# Patient Record
Sex: Male | Born: 1965 | Hispanic: Yes | Marital: Married | Smoking: Never smoker
Health system: Southern US, Community
[De-identification: ages and names within clinical notes are randomized; demographics above are authoritative.]

---

## 2015-06-28 ENCOUNTER — Emergency Department (HOSPITAL_COMMUNITY)
Admission: EM | Admit: 2015-06-28 | Discharge: 2015-06-28 | Disposition: A | Discharge status: Home / Self Care | Payer: Worker's Compensation | Attending: Emergency Medicine | Admitting: Emergency Medicine

## 2015-06-28 ENCOUNTER — Emergency Department (HOSPITAL_COMMUNITY): Payer: Worker's Compensation

## 2015-06-28 ENCOUNTER — Encounter (HOSPITAL_COMMUNITY): Payer: Self-pay | Admitting: Emergency Medicine

## 2015-06-28 DIAGNOSIS — S59901A Unspecified injury of right elbow, initial encounter: Secondary | ICD-10-CM | POA: Insufficient documentation

## 2015-06-28 DIAGNOSIS — Y9289 Other specified places as the place of occurrence of the external cause: Secondary | ICD-10-CM | POA: Insufficient documentation

## 2015-06-28 DIAGNOSIS — Y9389 Activity, other specified: Secondary | ICD-10-CM | POA: Insufficient documentation

## 2015-06-28 DIAGNOSIS — S81812A Laceration without foreign body, left lower leg, initial encounter: Secondary | ICD-10-CM | POA: Diagnosis not present

## 2015-06-28 DIAGNOSIS — W11XXXA Fall on and from ladder, initial encounter: Secondary | ICD-10-CM | POA: Diagnosis not present

## 2015-06-28 DIAGNOSIS — IMO0002 Reserved for concepts with insufficient information to code with codable children: Secondary | ICD-10-CM

## 2015-06-28 DIAGNOSIS — Z23 Encounter for immunization: Secondary | ICD-10-CM | POA: Insufficient documentation

## 2015-06-28 DIAGNOSIS — S3992XA Unspecified injury of lower back, initial encounter: Secondary | ICD-10-CM | POA: Insufficient documentation

## 2015-06-28 DIAGNOSIS — S81811A Laceration without foreign body, right lower leg, initial encounter: Secondary | ICD-10-CM | POA: Insufficient documentation

## 2015-06-28 DIAGNOSIS — Y998 Other external cause status: Secondary | ICD-10-CM | POA: Insufficient documentation

## 2015-06-28 DIAGNOSIS — W19XXXA Unspecified fall, initial encounter: Secondary | ICD-10-CM

## 2015-06-28 MED ORDER — LIDOCAINE-EPINEPHRINE 1 %-1:100000 IJ SOLN
20.0000 mL | Freq: Once | INTRAMUSCULAR | Status: AC
  Administered 2015-06-28: 20 mL via INTRADERMAL
  Filled 2015-06-28: qty 1

## 2015-06-28 MED ORDER — OXYCODONE-ACETAMINOPHEN 5-325 MG PO TABS: 1.0000 | ORAL_TABLET | Freq: Four times a day (QID) | ORAL | Status: AC | PRN

## 2015-06-28 MED ORDER — OXYCODONE-ACETAMINOPHEN 5-325 MG PO TABS
1.0000 | ORAL_TABLET | Freq: Four times a day (QID) | ORAL | Status: DC | PRN
Start: 2015-06-28 — End: 2015-06-28
  Administered 2015-06-28: 1 via ORAL
  Filled 2015-06-28: qty 1

## 2015-06-28 MED ORDER — TETANUS-DIPHTH-ACELL PERTUSSIS 5-2.5-18.5 LF-MCG/0.5 IM SUSP
0.5000 mL | Freq: Once | INTRAMUSCULAR | Status: AC
Start: 2015-06-28 — End: 2015-06-28
  Administered 2015-06-28: 0.5 mL via INTRAMUSCULAR
  Filled 2015-06-28: qty 0.5

## 2015-06-28 NOTE — ED Notes (Signed)
Pt fell from ladder approximately 12 ft and landed on feet hitting left shin on fall. Pt denies hitting head or any LOC. Pt denies neck or back pain. Pt c/o of left shin pain with laceration and localized swelling. Bleeding is controlled. Pt also c/o of right elbow pain.

## 2015-06-28 NOTE — Discharge Instructions (Signed)
Have your staples removed in 10 days with your primary care doctor or an urgent care center.  Return to the emergency department if there is evidence of infection. See attached information for details regarding possible wound infection.

## 2015-06-28 NOTE — ED Notes (Signed)
Pt had of fent. Prior to arrival.

## 2015-06-28 NOTE — ED Provider Notes (Signed)
CSN: [id number]     Arrival date & time 06/28/15  1636 History   First MD Initiated Contact with Patient 06/28/15 1637     Chief Complaint  Patient presents with  . Fall     (Consider location/radiation/quality/duration/timing/severity/associated sxs/prior Treatment) Patient is a 50 y.o. male presenting with general illness. The history is provided by the patient. The history is limited by a language barrier. A language interpreter was used.  Illness Location:  Fall from ladder Severity:  Mild Onset quality:  Sudden Duration:  1 hour Timing:  Constant Progression:  Unchanged Chronicity:  New Context:  Feet were 12 feet off the ground. Was on a ladder. While falling scraped anterior aspects of bilateral legs. He landed on both feet. Did not fall down. Did not hit his head. Did not lose consciousness. Associated symptoms: no abdominal pain, no chest pain, no cough, no headaches, no nausea, no shortness of breath and no vomiting     History reviewed. No pertinent past medical history. History reviewed. No pertinent past surgical history. No family history on file. Social History  Substance Use Topics  . Smoking status: Never Smoker   . Smokeless tobacco: None  . Alcohol Use: No    Review of Systems  Respiratory: Negative for cough and shortness of breath.   Cardiovascular: Negative for chest pain.  Gastrointestinal: Negative for nausea, vomiting and abdominal pain.  Musculoskeletal: Positive for back pain. Negative for neck pain.  Skin: Positive for wound.  Neurological: Negative for syncope, light-headedness and headaches.  All other systems reviewed and are negative.     Allergies  Review of patient's allergies indicates no known allergies.  Home Medications   Prior to Admission medications   Medication Sig Start Date End Date Taking? Authorizing Provider  oxyCODONE-acetaminophen (PERCOCET/ROXICET) 5-325 MG tablet Take 1 tablet by mouth every 6 (six) hours as needed  for severe pain. 06/28/15   Lindalou Hose, MD   BP 128/77 mmHg  Pulse 69  Temp(Src) 98.3 F (36.8 C) (Oral)  Resp 15  Ht  (1.702 m)  Wt 86.183 kg  BMI 29.75 kg/m2  SpO2 100% Physical Exam  Constitutional: He is oriented to person, place, and time. He appears well-developed and well-nourished. No distress.  HENT:  Head: Normocephalic and atraumatic.  Eyes: EOM are normal. Pupils are equal, round, and reactive to light.  Neck: No spinous process tenderness present.  Cardiovascular: Normal rate, regular rhythm and intact distal pulses.   Pulmonary/Chest: Effort normal. No respiratory distress.  Abdominal: Soft. There is no tenderness.  Musculoskeletal: He exhibits no edema.       Right elbow: He exhibits normal range of motion, no deformity and no laceration. Tenderness found.       Right hip: Normal.       Left hip: Normal.       Cervical back: He exhibits no tenderness.       Thoracic back: He exhibits no tenderness.       Lumbar back: He exhibits bony tenderness.       Right upper leg: Normal.       Left upper leg: Normal.       Right lower leg: He exhibits laceration.       Left lower leg: He exhibits laceration.       Right foot: There is no tenderness.       Left foot: Normal. There is no tenderness.  Neurological: He is alert and oriented to person, place, and time. He  has normal strength. No sensory deficit. GCS eye subscore is 4. GCS verbal subscore is 5. GCS motor subscore is 6.  Skin: Skin is warm and dry.    ED Course  .Marland KitchenLaceration Repair Date/Time: 06/29/2015 3:07 PM Performed by: Lindalou Hose Authorized by: Lindalou Hose Consent: Verbal consent obtained. Consent given by: patient Patient understanding: patient states understanding of the procedure being performed Body area: lower extremity Location details: right lower leg Laceration length: 7 cm Foreign bodies: no foreign bodies Tendon involvement: none Nerve involvement: none Vascular damage:  no Anesthesia: local infiltration Local anesthetic: lidocaine 1% without epinephrine Anesthetic total: 5 ml Patient sedated: no Irrigation solution: saline Irrigation method: jet lavage Amount of cleaning: standard Debridement: none Degree of undermining: none Skin closure: staples Technique: simple Approximation: close Approximation difficulty: simple Patient tolerance: Patient tolerated the procedure well with no immediate complications  .Marland KitchenLaceration Repair Date/Time: 06/29/2015 3:07 PM Performed by: Lindalou Hose Authorized by: Lindalou Hose Consent: Verbal consent obtained. Consent given by: patient Patient understanding: patient states understanding of the procedure being performed Body area: lower extremity Location details: left lower leg Laceration length: 6 cm Foreign bodies: no foreign bodies Tendon involvement: none Nerve involvement: none Vascular damage: no Anesthesia: local infiltration Local anesthetic: lidocaine 1% without epinephrine Anesthetic total: 5 ml Patient sedated: no Irrigation solution: saline Irrigation method: jet lavage Amount of cleaning: standard Debridement: none Degree of undermining: none Skin closure: staples Technique: simple Approximation: close Approximation difficulty: simple Patient tolerance: Patient tolerated the procedure well with no immediate complications   (including critical care time) Labs Review Labs Reviewed - No data to display  Imaging Review Dg Lumbar Spine 2-3 Views  06/28/2015  CLINICAL DATA:  Slipped at work today and fell 12 feet off a ladder landing on feet, struck both shins on the ladder, no loss of consciousness, slight back tenderness, shin lacerations and pain, RIGHT posterior elbow pain that sharply shoots up into the shoulder when elbow is rotated, initial encounter EXAM: LUMBAR SPINE - 2-3 VIEW COMPARISON:  None FINDINGS: Five non-rib-bearing lumbar vertebra. Vertebral body and disc space heights  maintained. Small superior endplate spur at L4. No acute fracture, subluxation or bone destruction. SI joints symmetric. IMPRESSION: No acute osseous abnormalities. Electronically Signed   By: Ulyses Southward M.D.   On: 06/28/2015 18:02   Dg Pelvis 1-2 Views  06/28/2015  CLINICAL DATA:  Slipped at work today and fell 12 feet off a ladder landing on feet, struck both shins on the ladder, no loss of consciousness, slight back tenderness, shin lacerations and pain, RIGHT posterior elbow pain that sharply shoots up into the shoulder when elbow is rotated, initial encounter EXAM: PELVIS - 1-2 VIEW COMPARISON:  None FINDINGS: Hip and SI joints symmetric and preserved. Osseous mineralization normal. No acute fracture, dislocation, or bone destruction. IMPRESSION: No acute osseous abnormalities. Electronically Signed   By: Ulyses Southward M.D.   On: 06/28/2015 18:04   Dg Elbow 2 Views Right  06/28/2015  CLINICAL DATA:  Slipped at work today and fell 12 feet off a ladder landing on feet, struck both shins on the ladder, no loss of consciousness, slight back tenderness, shin lacerations and pain, RIGHT posterior elbow pain that sharply shoots up into the shoulder when elbow is rotated, initial encounter EXAM: RIGHT ELBOW - 2 VIEW COMPARISON:  None FINDINGS: Bone mineralization normal. Joint spaces preserved. No fracture, dislocation, or bone destruction. No elbow joint effusion. IMPRESSION: No acute osseous abnormalities. Electronically Signed   By: Loraine Leriche  Tyron RussellBoles M.D.   On: 06/28/2015 18:00   Dg Tibia/fibula Left  06/28/2015  ADDENDUM REPORT: 06/28/2015 18:05 ADDENDUM: Correction of typographical errors: CLINICAL DATA:  Slipped at work today and fell 12 feet off a ladder landing on feet, struck both shins on the ladder, no loss of consciousness, slight back tenderness, shin lacerations and pain, RIGHT posterior elbow pain that sharply shoots up into the shoulder when elbow is rotated, initial encounter Electronically Signed    By: Ulyses SouthwardMark  Boles M.D.   On: 06/28/2015 18:05  06/28/2015  CLINICAL DATA:  Slipped at work today and fell 12 feet off a ladder landing on feet, struck both shins on the ladder, no loss of consciousness, slight back tenderness, she lacerations and pain, RIGHT posterior elbow pain at sharply shoots up into the shoulder when elbow is rotated, initial encounter EXAM: LEFT TIBIA AND FIBULA - 2 VIEW COMPARISON:  None FINDINGS: Osseous mineralization normal. Knee and elbow joint alignments normal. No acute fracture, dislocation, or bone destruction. Bony excrescence identified at the posterior margin of the proximal fibula, appears non articular, question osteochondroma, unlikely a spur related to the proximal tibial fibular joint. IMPRESSION: No acute osseous abnormalities. Question osteochondroma arising from the proximal LEFT fibula. Electronically Signed: By: Ulyses SouthwardMark  Boles M.D. On: 06/28/2015 18:00   Dg Tibia/fibula Right  06/28/2015  CLINICAL DATA:  Slipped at work today and fell 12 feet off a ladder landing on feet, struck both shins on the ladder, no loss of consciousness, slight back tenderness, shin lacerations and pain, RIGHT posterior elbow pain that sharply shoots up into the shoulder when elbow is rotated, initial encounter EXAM: RIGHT TIBIA AND FIBULA - 2 VIEW COMPARISON:  None FINDINGS: Osseous mineralization normal. Joint spaces preserved. No fracture, dislocation, or bone destruction. Small foci of soft tissue gas laterally at the mid RIGHT lower leg consistent with history of laceration. IMPRESSION: No acute osseous abnormalities. Electronically Signed   By: Ulyses SouthwardMark  Boles M.D.   On: 06/28/2015 18:04   I have personally reviewed and evaluated these images and lab results as part of my medical decision-making.   EKG Interpretation None      MDM   Final diagnoses:  Fall  Laceration    50 year old male presents after falling from a ladder. No loss conscious. Denies headache or vision changes.  Doesn't have any focal neurological deficits. Doubt intracranial injury. Did have some tenderness palpation of his lower back. No evidence of fracture on the films. Also had some tenderness in his right elbow and bilateral lower legs. No evidence of fractures on xrays for those either. No tenderness palpation of his feet. Doubt calcaneal injury. Lacerations to anterior aspect of bilateral lower legs. See above for repair information. Tetanus given.  Return precautions provided. Encouraged him to follow up with his primary care doctor or an urgent care center in 10-14 days for staple remover. Warned him about the possibility of occult fracture. Patient discharged in stable condition.  Lindalou HoseSean O'Rourke, MD 06/29/15 [id number]  Zadie Rhineonald Wickline, MD 06/30/15 [phone]

## 2017-07-31 IMAGING — CR DG TIBIA/FIBULA 2V*R*
3 series · 3 of 3 positions shown · non-contrast
Comparison: None

CLINICAL DATA: Slipped at work today and fell 12 feet off a ladder
landing on feet, struck both shins on the ladder, no loss of
consciousness, slight back tenderness, shin lacerations and pain,
RIGHT posterior elbow pain that sharply shoots up into the shoulder
when elbow is rotated, initial encounter

EXAM:
RIGHT TIBIA AND FIBULA - 2 VIEW

[tibia ap (1 of 2)]
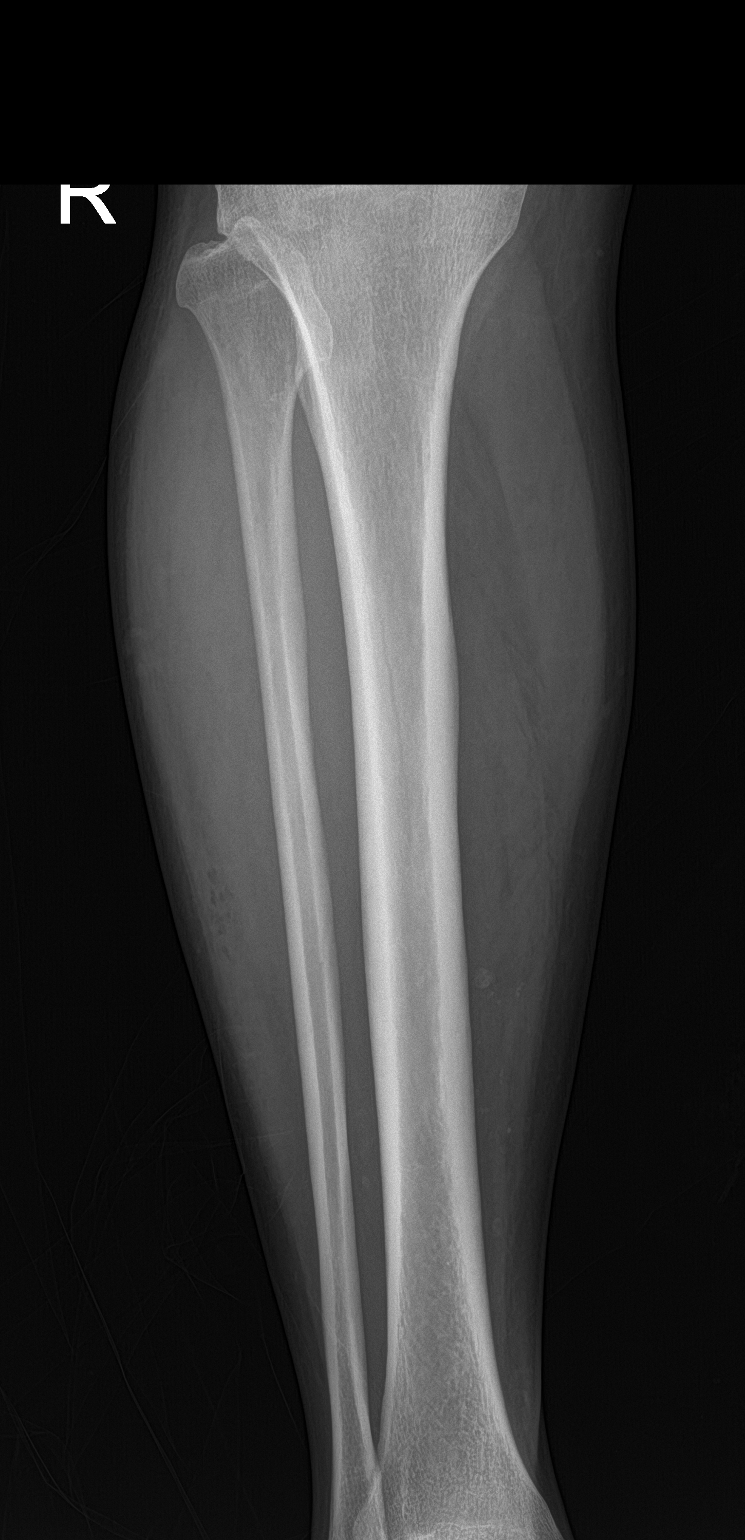

[tibia ap (2 of 2)]
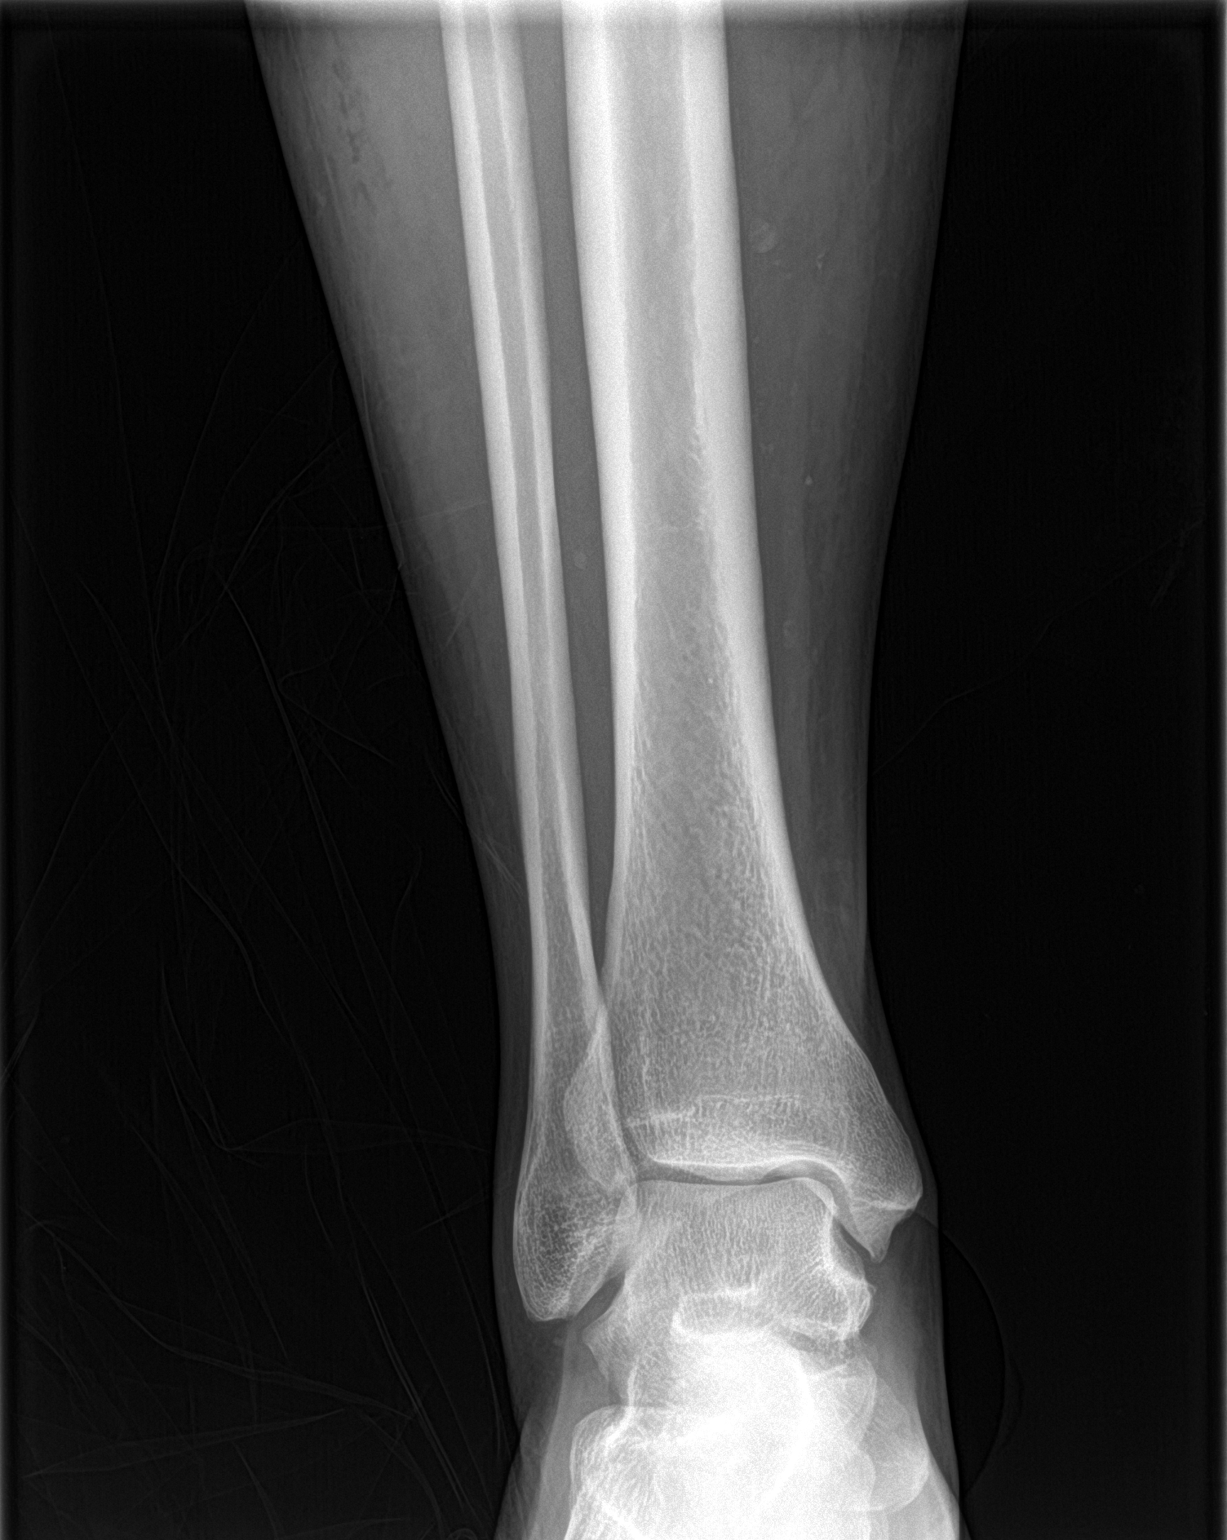

[tibia lat]
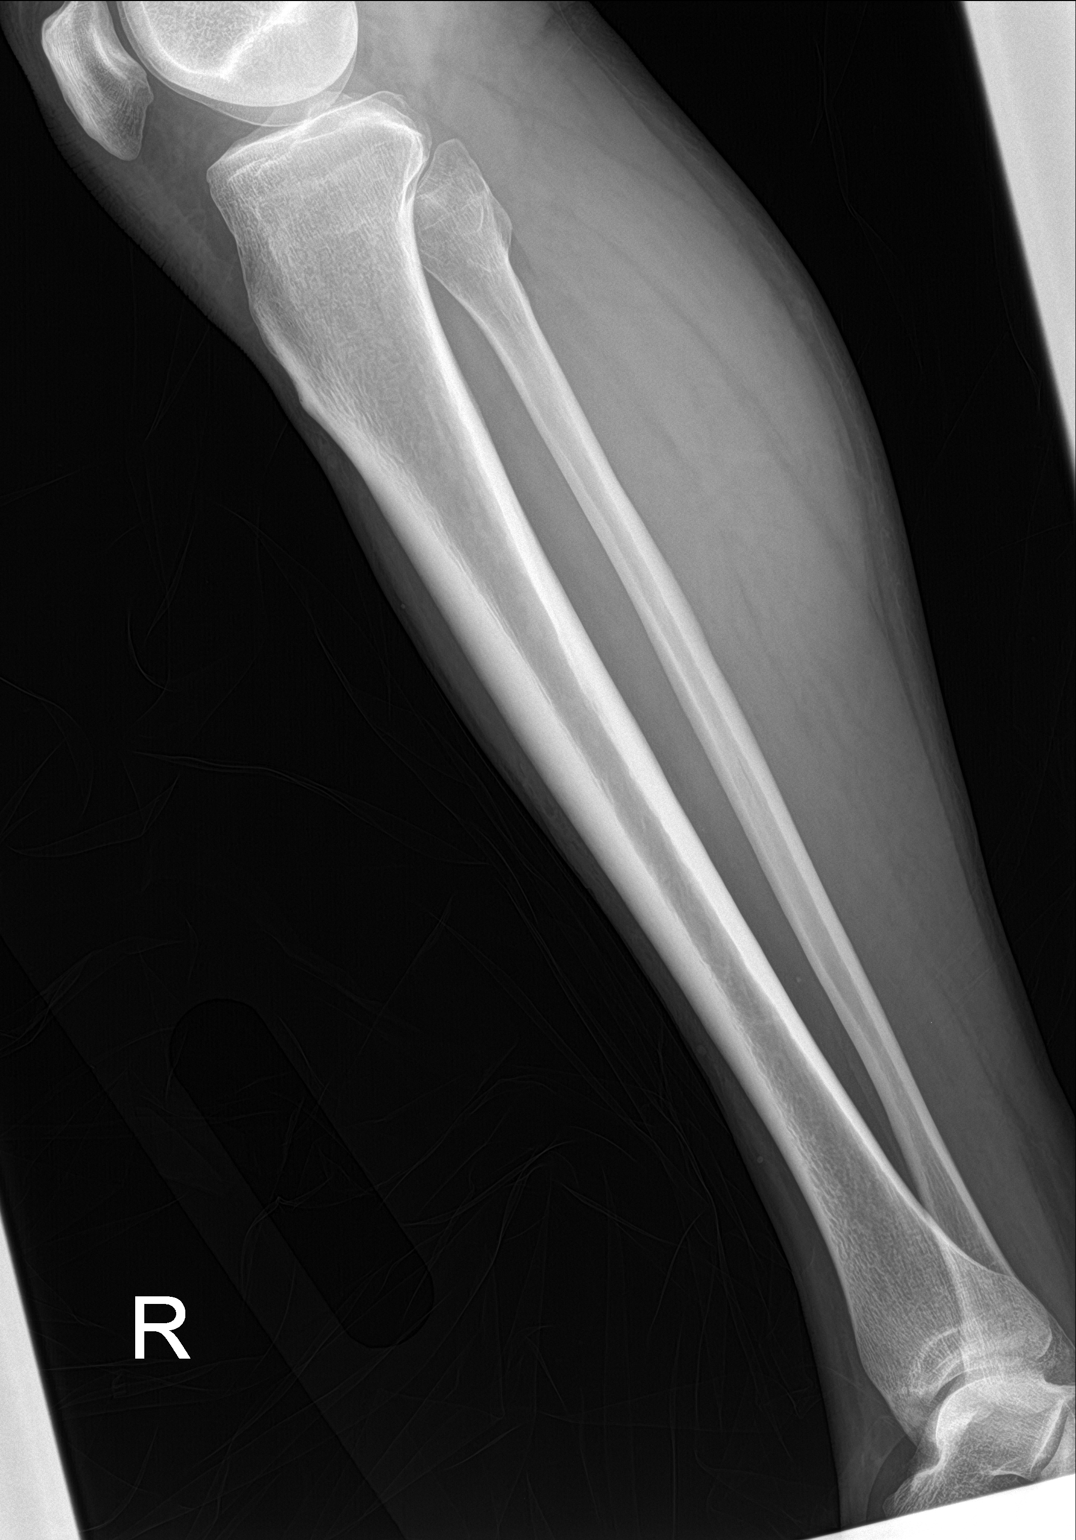

[3 of 3 positions shown; findings below may reference images not displayed]

FINDINGS: Osseous mineralization normal.

Joint spaces preserved.

No fracture, dislocation, or bone destruction.

Small foci of soft tissue gas laterally at the mid RIGHT lower leg
consistent with history of laceration.
IMPRESSION: No acute osseous abnormalities.

## 2017-07-31 IMAGING — CR DG PELVIS 1-2V
1 series · 1 of 1 positions shown · non-contrast
Comparison: None

CLINICAL DATA: Slipped at work today and fell 12 feet off a ladder
landing on feet, struck both shins on the ladder, no loss of
consciousness, slight back tenderness, shin lacerations and pain,
RIGHT posterior elbow pain that sharply shoots up into the shoulder
when elbow is rotated, initial encounter

EXAM:
PELVIS - 1-2 VIEW

[pelvis ap]
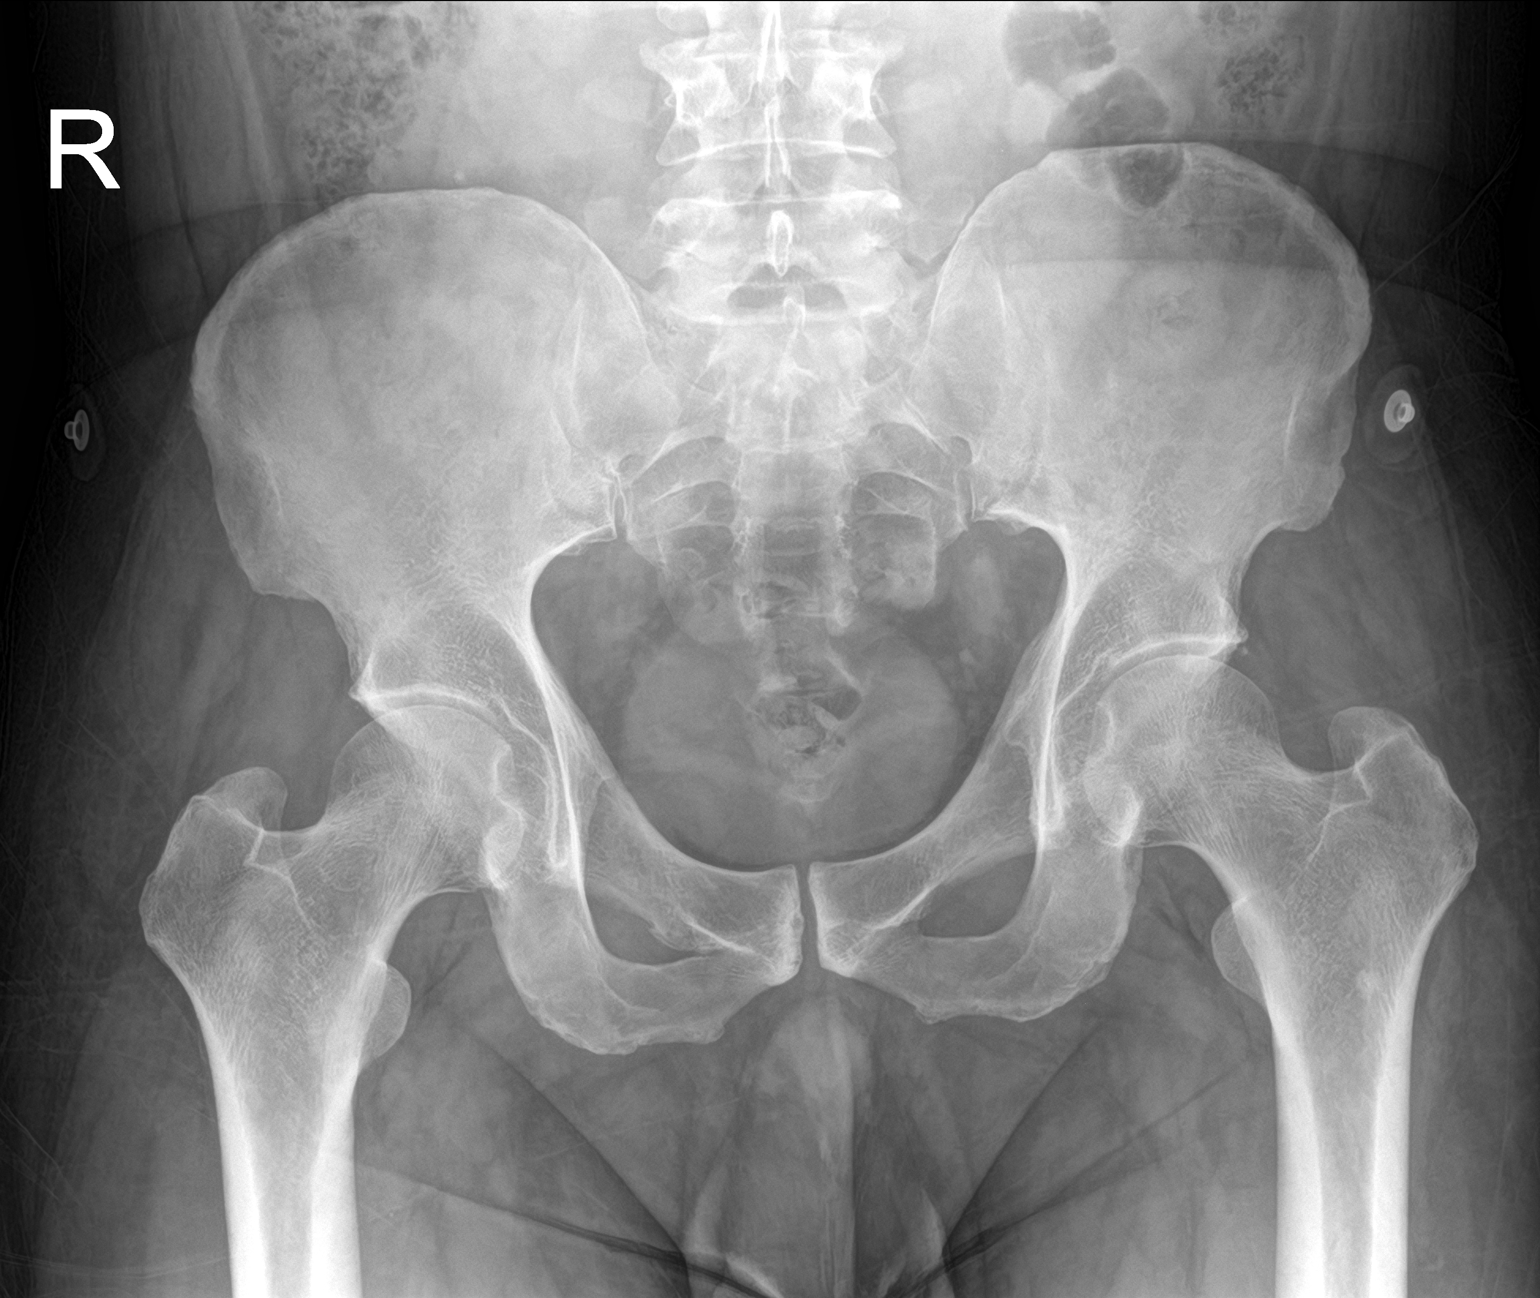

[1 of 1 positions shown; findings below may reference images not displayed]

FINDINGS: Hip and SI joints symmetric and preserved.

Osseous mineralization normal.

No acute fracture, dislocation, or bone destruction.
IMPRESSION: No acute osseous abnormalities.
